# Patient Record
Sex: Male | Born: 1995 | Hispanic: No | Marital: Single | Smoking: Never smoker
Health system: Southern US, Community
[De-identification: ages and names within clinical notes are randomized; demographics above are authoritative.]

## PROBLEM LIST (undated history)

## (undated) DIAGNOSIS — M549 Dorsalgia, unspecified: Secondary | ICD-10-CM

## (undated) DIAGNOSIS — K589 Irritable bowel syndrome without diarrhea: Secondary | ICD-10-CM

## (undated) DIAGNOSIS — H5319 Other subjective visual disturbances: Secondary | ICD-10-CM

## (undated) DIAGNOSIS — M25552 Pain in left hip: Secondary | ICD-10-CM

## (undated) DIAGNOSIS — M4120 Other idiopathic scoliosis, site unspecified: Secondary | ICD-10-CM

## (undated) DIAGNOSIS — R7989 Other specified abnormal findings of blood chemistry: Secondary | ICD-10-CM

## (undated) DIAGNOSIS — G8929 Other chronic pain: Secondary | ICD-10-CM

## (undated) DIAGNOSIS — T7840XA Allergy, unspecified, initial encounter: Secondary | ICD-10-CM

## (undated) DIAGNOSIS — H53129 Transient visual loss, unspecified eye: Secondary | ICD-10-CM

## (undated) DIAGNOSIS — R519 Headache, unspecified: Secondary | ICD-10-CM

## (undated) DIAGNOSIS — Q760 Spina bifida occulta: Secondary | ICD-10-CM

## (undated) DIAGNOSIS — R51 Headache: Secondary | ICD-10-CM

## (undated) DIAGNOSIS — M5412 Radiculopathy, cervical region: Secondary | ICD-10-CM

## (undated) DIAGNOSIS — R0683 Snoring: Secondary | ICD-10-CM

## (undated) HISTORY — DX: Other chronic pain: G89.29

## (undated) HISTORY — DX: Allergy, unspecified, initial encounter: T78.40XA

## (undated) HISTORY — DX: Snoring: R06.83

## (undated) HISTORY — PX: TONSILLECTOMY AND ADENOIDECTOMY: SUR1326

## (undated) HISTORY — DX: Headache: R51

## (undated) HISTORY — DX: Transient visual loss, unspecified eye: H53.129

## (undated) HISTORY — DX: Headache, unspecified: R51.9

## (undated) HISTORY — DX: Other specified abnormal findings of blood chemistry: R79.89

## (undated) HISTORY — DX: Radiculopathy, cervical region: M54.12

## (undated) HISTORY — PX: TYMPANOSTOMY TUBE PLACEMENT: SHX32

## (undated) HISTORY — DX: Dorsalgia, unspecified: M54.9

## (undated) HISTORY — DX: Other idiopathic scoliosis, site unspecified: M41.20

## (undated) HISTORY — DX: Irritable bowel syndrome, unspecified: K58.9

## (undated) HISTORY — DX: Spina bifida occulta: Q76.0

## (undated) HISTORY — DX: Other subjective visual disturbances: H53.19

## (undated) HISTORY — DX: Pain in left hip: M25.552

---
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
## 2015-06-06 ENCOUNTER — Encounter: Payer: Self-pay | Admitting: Family Medicine

## 2015-06-06 ENCOUNTER — Ambulatory Visit (INDEPENDENT_AMBULATORY_CARE_PROVIDER_SITE_OTHER): Payer: 59 | Admitting: Family Medicine

## 2015-06-06 VITALS — BP 126/84 | HR 105 | Temp 102.0°F | Resp 18 | Ht 65.0 in | Wt 284.3 lb

## 2015-06-06 DIAGNOSIS — G8929 Other chronic pain: Secondary | ICD-10-CM | POA: Insufficient documentation

## 2015-06-06 DIAGNOSIS — J069 Acute upper respiratory infection, unspecified: Secondary | ICD-10-CM | POA: Diagnosis not present

## 2015-06-06 DIAGNOSIS — M7062 Trochanteric bursitis, left hip: Secondary | ICD-10-CM | POA: Insufficient documentation

## 2015-06-06 DIAGNOSIS — M549 Dorsalgia, unspecified: Secondary | ICD-10-CM

## 2015-06-06 DIAGNOSIS — E669 Obesity, unspecified: Secondary | ICD-10-CM | POA: Insufficient documentation

## 2015-06-06 DIAGNOSIS — G43109 Migraine with aura, not intractable, without status migrainosus: Secondary | ICD-10-CM | POA: Insufficient documentation

## 2015-06-06 DIAGNOSIS — J302 Other seasonal allergic rhinitis: Secondary | ICD-10-CM | POA: Insufficient documentation

## 2015-06-06 DIAGNOSIS — M4100 Infantile idiopathic scoliosis, site unspecified: Secondary | ICD-10-CM | POA: Insufficient documentation

## 2015-06-06 DIAGNOSIS — K589 Irritable bowel syndrome without diarrhea: Secondary | ICD-10-CM | POA: Insufficient documentation

## 2015-06-06 DIAGNOSIS — Q760 Spina bifida occulta: Secondary | ICD-10-CM | POA: Insufficient documentation

## 2015-06-06 MED ORDER — IBUPROFEN 800 MG PO TABS: 800.0000 mg | ORAL_TABLET | Freq: Three times a day (TID) | ORAL | Status: DC | PRN

## 2015-06-06 MED ORDER — BENZONATATE 100 MG PO CAPS: 100.0000 mg | ORAL_CAPSULE | Freq: Three times a day (TID) | ORAL | Status: DC | PRN

## 2015-06-06 NOTE — Patient Instructions (Signed)
Trochanteric Bursitis You have hip pain due to trochanteric bursitis. Bursitis means that the sack near the outside of the hip is filled with fluid and inflamed. This sack is made up of protective soft tissue. The pain from trochanteric bursitis can be severe and keep you from sleep. It can radiate to the buttocks or down the outside of the thigh to the knee. The pain is almost always worse when rising from the seated or lying position and with walking. Pain can improve after you take a few steps. It happens more often in people with hip joint and lumbar spine problems, such as arthritis or previous surgery. Very rarely the trochanteric bursa can become infected, and antibiotics and/or surgery may be needed. Treatment often includes an injection of local anesthetic mixed with cortisone medicine. This medicine is injected into the area where it is most tender over the hip. Repeat injections may be necessary if the response to treatment is slow. You can apply ice packs over the tender area for 30 minutes every 2 hours for the next few days. Anti-inflammatory and/or narcotic pain medicine may also be helpful. Limit your activity for the next few days if the pain continues. See your caregiver in 5-10 days if you are not greatly improved.  SEEK IMMEDIATE MEDICAL CARE IF:  You develop severe pain, fever, or increased redness.  You have pain that radiates below the knee. EXERCISES STRETCHING EXERCISES - Trochanteric Bursitis  These exercises may help you when beginning to rehabilitate your injury. Your symptoms may resolve with or without further involvement from your physician, physical therapist, or athletic trainer. While completing these exercises, remember:   Restoring tissue flexibility helps normal motion to return to the joints. This allows healthier, less painful movement and activity.  An effective stretch should be held for at least 30 seconds.  A stretch should never be painful. You should only  feel a gentle lengthening or release in the stretched tissue. STRETCH - Iliotibial Band  On the floor or bed, lie on your side so your injured leg is on top. Bend your knee and grab your ankle.  Slowly bring your knee back so that your thigh is in line with your trunk. Keep your heel at your buttocks and gently arch your back so your head, shoulders and hips line up.  Slowly lower your leg so that your knee approaches the floor/bed until you feel a gentle stretch on the outside of your thigh. If you do not feel a stretch and your knee will not fall farther, place the heel of your opposite foot on top of your knee and pull your thigh down farther.  Hold this stretch for __________ seconds.  Repeat __________ times. Complete this exercise __________ times per day. STRETCH - Hamstrings, Supine   Lie on your back. Loop a belt or towel over the ball of your foot as shown.  Straighten your knee and slowly pull on the belt to raise your injured leg. Do not allow the knee to bend. Keep your opposite leg flat on the floor.  Raise the leg until you feel a gentle stretch behind your knee or thigh. Hold this position for __________ seconds.  Repeat __________ times. Complete this stretch __________ times per day. STRETCH - Quadriceps, Prone   Lie on your stomach on a firm surface, such as a bed or padded floor.  Bend your knee and grasp your ankle. If you are unable to reach your ankle or pant leg, use a belt   around your foot to lengthen your reach.  Gently pull your heel toward your buttocks. Your knee should not slide out to the side. You should feel a stretch in the front of your thigh and/or knee.  Hold this position for __________ seconds.  Repeat __________ times. Complete this stretch __________ times per day. STRETCHING - Hip Flexors, Lunge Half kneel with your knee on the floor and your opposite knee bent and directly over your ankle.  Keep good posture with your head over your  shoulders. Tighten your buttocks to point your tailbone downward; this will prevent your back from arching too much.  You should feel a gentle stretch in the front of your thigh and/or hip. If you do not feel any resistance, slightly slide your opposite foot forward and then slowly lunge forward so your knee once again lines up over your ankle. Be sure your tailbone remains pointed downward.  Hold this stretch for __________ seconds.  Repeat __________ times. Complete this stretch __________ times per day. STRETCH - Adductors, Lunge  While standing, spread your legs.  Lean away from your injured leg by bending your opposite knee. You may rest your hands on your thigh for balance.  You should feel a stretch in your inner thigh. Hold for __________ seconds.  Repeat __________ times. Complete this exercise __________ times per day. Document Released: 10/21/2004 Document Revised: 01/28/2014 Document Reviewed: 12/26/2008 Exit[employer] Patient Information 2015 [employer]. This information is not intended to replace advice given to you by your health care provider. Make sure you discuss any questions you have with your health care provider.  

## 2015-06-06 NOTE — Progress Notes (Signed)
Name: Kerry Ford   MRN: [id number]    DOB: [birth year]   Date:06/06/2015       Progress Note  Subjective  Chief Complaint  Chief Complaint  Patient presents with  . URI    Onset-Tuesday morning with a sore throat, fever, chills, stuffy nose, drainage, cough-green phlegm, and chest discomfort from coughing. Taking DayQuil and NyQuil with some relief.  . Hip Pain    Left Hip-Onset-1 month, Intermittent pain, on feet all day and had cortisone shot in the past and it helped.     HPI  URI: he has been sick for the past four days, it started with sore throat, nasal congestion, rhinorrhea, post-nasal drainage, facial pressure, followed by a wet cough. He is taking otc medication and symptoms are better, except that he had a fever this am ( checked at work ) did not take any antipyretics since this am, and is afebrile and feeling better now. He came in to make sure he is okay. Worse symptoms now is the cough  Left hip pain: previous history of trochanteric bursitis, and improved with steroid injections years ago. Pain is described as aching / throbbing , intermittent, worse when he gets home from work and goes to bed. Pressure and sleeping on left lateral decubitus aggravates it. He has been taking Motrin prn.    Patient Active Problem List   Diagnosis Date Noted  . IBS (irritable bowel syndrome) 06/06/2015  . Childhood obesity 06/06/2015  . Seasonal allergic rhinitis 06/06/2015  . Spina bifida occulta 06/06/2015  . Chronic back pain 06/06/2015  . Scoliosis, idiopathic, infantile 06/06/2015  . 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 Trochanteric bursitis of left hip 06/06/2015    Past Surgical History  Procedure Laterality Date  . Tympanostomy tube placement    . Tonsillectomy and adenoidectomy      Family History  Problem Relation Age of Onset  . Hypothyroidism Mother     Social History   Social History  . Marital Status: Single    Spouse Name: N/A  . Number of Children:  N/A  . Years of Education: N/A   Occupational History  . Not on file.   Social History Main Topics  . Smoking status: Never Smoker   . Smokeless tobacco: Never Used  . Alcohol Use: No  . Drug Use: No  . Sexual Activity: Not Currently   Other Topics Concern  . Not on file   Social History Narrative     Current outpatient prescriptions:  .  benzonatate (TESSALON) 100 MG capsule, Take 1 capsule (100 mg total) by mouth 3 (three) times daily as needed for cough., Disp: 40 capsule, Rfl: 0  Allergies  Allergen Reactions  . Wasp Venom Swelling     ROS  Constitutional: Negative for fever or weight change.  Respiratory: positive  for cough no shortness of breath.   Cardiovascular: Negative for chest pain or palpitations.  Gastrointestinal: Negative for abdominal pain, no bowel changes.  Musculoskeletal: Negative for gait problem or joint swelling.  Skin: Negative for rash.  Neurological: Negative for dizziness or  headache.  No other specific complaints in a complete review of systems (except as listed in HPI above).  Objective  Filed Vitals:   06/06/15 1629  BP: 126/84  Pulse: 105  Temp: 102 F (38.9 C)  TempSrc: Oral  Resp: 18  Height:  (1.651 m)  Weight: 284 lb 4.8 oz (128.958 kg)  SpO2: 97%    Body mass index is  47.31 kg/(m^2).  Physical Exam Constitutional: Patient appears well-developed and well-nourished. Obese No distress.  HEENT: head atraumatic, normocephalic, pupils equal and reactive to light, ears normal TM, neck supple, throat within normal limits, mild erythema of soft palate , tender during percussion of maxillary sinus bilaterally  Cardiovascular: Normal rate, regular rhythm and normal heart sounds.  No murmur heard. No BLE edema. Pulmonary/Chest: Effort normal and breath sounds normal. No respiratory distress. Abdominal: Soft.  There is no tenderness. Psychiatric: Patient has a normal mood and affect. behavior is normal. Judgment and thought  content normal. Muscular Skeletal: pain during palpation of left lateral trochanteric bursa, and with external rotation of left hip  PHQ2/9: Depression screen PHQ 2/9 06/06/2015  Decreased Interest 0  Down, Depressed, Hopeless 0  PHQ - 2 Score 0     Fall Risk: Fall Risk  06/06/2015  Falls in the past year? No    Functional Status Survey: Is the patient deaf or have difficulty hearing?: No Does the patient have difficulty seeing, even when wearing glasses/contacts?: Yes (glasses) Does the patient have difficulty concentrating, remembering, or making decisions?: No Does the patient have difficulty walking or climbing stairs?: No Does the patient have difficulty dressing or bathing?: No Does the patient have difficulty doing errands alone such as visiting a doctor's office or shopping?: No    Assessment & Plan  1. Upper respiratory infection Advised to continue otc medication, rest , fluids and start cough suppressant, may also take mucinex otc, return if worsening of symptoms - benzonatate (TESSALON) 100 MG capsule; Take 1 capsule (100 mg total) by mouth 3 (three) times daily as needed for cough.  Dispense: 40 capsule; Refill: 0   2. Trochanteric bursitis of left hip He will resume Motrin and return for a bursa injection at his earliest convenience  - ibuprofen (ADVIL,MOTRIN) 800 MG tablet; Take 1 tablet (800 mg total) by mouth every 8 (eight) hours as needed for moderate pain.  Dispense: 30 tablet; Refill: 0

## 2015-06-07 ENCOUNTER — Encounter: Payer: Self-pay | Admitting: Family Medicine

## 2015-06-07 DIAGNOSIS — R0683 Snoring: Secondary | ICD-10-CM | POA: Insufficient documentation

## 2015-06-07 DIAGNOSIS — R519 Headache, unspecified: Secondary | ICD-10-CM | POA: Insufficient documentation

## 2015-06-07 DIAGNOSIS — R51 Headache: Secondary | ICD-10-CM

## 2015-06-07 DIAGNOSIS — E349 Endocrine disorder, unspecified: Secondary | ICD-10-CM | POA: Insufficient documentation

## 2015-06-09 ENCOUNTER — Ambulatory Visit: Payer: 59 | Admitting: Family Medicine

## 2015-06-10 ENCOUNTER — Encounter: Payer: Self-pay | Admitting: Family Medicine

## 2015-06-10 ENCOUNTER — Ambulatory Visit (INDEPENDENT_AMBULATORY_CARE_PROVIDER_SITE_OTHER): Payer: 59 | Admitting: Family Medicine

## 2015-06-10 VITALS — BP 116/82 | HR 93 | Temp 98.2°F | Resp 16 | Ht 65.0 in | Wt 280.0 lb

## 2015-06-10 DIAGNOSIS — M7062 Trochanteric bursitis, left hip: Secondary | ICD-10-CM

## 2015-06-10 DIAGNOSIS — M4100 Infantile idiopathic scoliosis, site unspecified: Secondary | ICD-10-CM

## 2015-06-10 DIAGNOSIS — Z23 Encounter for immunization: Secondary | ICD-10-CM | POA: Diagnosis not present

## 2015-06-10 MED ORDER — LIDOCAINE HCL (PF) 1 % IJ SOLN
2.0000 mL | Freq: Once | INTRAMUSCULAR | Status: AC
  Administered 2015-06-10: 2 mL via INTRADERMAL

## 2015-06-10 MED ORDER — TRIAMCINOLONE ACETONIDE 40 MG/ML IJ SUSP
40.0000 mg | Freq: Once | INTRAMUSCULAR | Status: AC
  Administered 2015-06-10: 40 mg via INTRAMUSCULAR

## 2015-06-10 NOTE — Progress Notes (Signed)
Name: Kerry Ford   MRN: [id number]    DOB: 06-25-1996   Date:06/10/2015       Progress Note  Subjective  Chief Complaint  No chief complaint on file.   HPI  Trochanteric bursa left: : previous history of trochanteric bursitis, and improved with steroid injections years ago. Pain is described as aching / throbbing , intermittent, worse when he gets home from work and goes to bed, also when standing or exercising. Pressure and sleeping on left lateral decubitus aggravates it. He has been taking Motrin prn. He cam in today for an bursa injections  Scoliosis: seen years ago at [facility], he continues to have back pain, left shoulder lower than right side, pain is aching like, daily and constant located on lumbar back. At times back feels very stiff.   Patient Active Problem List   Diagnosis Date Noted  . Snores 06/07/2015  . Hypotestosteronism 06/07/2015  . Bilateral headache 06/07/2015  . IBS (irritable bowel syndrome) 06/06/2015  . Childhood obesity 06/06/2015  . Seasonal allergic rhinitis 06/06/2015  . Spina bifida occulta 06/06/2015  . Chronic back pain 06/06/2015  . Scoliosis, idiopathic, infantile 06/06/2015  . 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 Trochanteric bursitis of left hip 06/06/2015  . Breast development in males 08/31/2013  . Iliotibial band syndrome 12/22/2011  . Back pain, chronic 05/23/2009    Past Surgical History  Procedure Laterality Date  . Tympanostomy tube placement    . Tonsillectomy and adenoidectomy      Family History  Problem Relation Age of Onset  . Hypothyroidism Mother     Social History   Social History  . Marital Status: Single    Spouse Name: N/A  . Number of Children: N/A  . Years of Education: N/A   Occupational History  . Not on file.   Social History Main Topics  . Smoking status: Never Smoker   . Smokeless tobacco: Never Used  . Alcohol Use: No  . Drug Use: No  . Sexual Activity: Not Currently   Other Topics  Concern  . Not on file   Social History Narrative     Current outpatient prescriptions:  .  benzonatate (TESSALON) 100 MG capsule, Take 1 capsule (100 mg total) by mouth 3 (three) times daily as needed for cough., Disp: 40 capsule, Rfl: 0 .  ibuprofen (ADVIL,MOTRIN) 800 MG tablet, Take 1 tablet (800 mg total) by mouth every 8 (eight) hours as needed for moderate pain., Disp: 30 tablet, Rfl: 0  Allergies  Allergen Reactions  . Wasp Venom Swelling     ROS  Constitutional: Negative for fever or weight change.  Respiratory: positive for cough ( but improving )no shortness of breath.  Cardiovascular: Negative for chest pain or palpitations.  Gastrointestinal: Negative for abdominal pain, no bowel changes.  Musculoskeletal: Negative for gait problem or joint swelling. Back pain Skin: Negative for rash.  Neurological: Negative for dizziness or headache.  No other specific complaints in a complete review of systems (except as listed in HPI above  Objective  BP 116/82 mmHg  Pulse 93  Temp(Src) 98.2 F (36.8 C) (Oral)  Resp 16  Ht 5\' 5"  (1.651 m)  Wt 280 lb (127.007 kg)  BMI 46.59 kg/m2  SpO2 98%   Physical Exam   Constitutional: Patient appears well-developed and well-nourished. Obese No distress.  HEENT: head atraumatic, normocephalic, pupils equal and reactive to light,neck supple, throat within normal limits Cardiovascular: Normal rate, regular rhythm and normal heart sounds.  No  murmur heard. No BLE edema. Pulmonary/Chest: Effort normal and breath sounds normal. No respiratory distress. Abdominal: Soft.  There is no tenderness. Psychiatric: Patient has a normal mood and affect. behavior is normal. Judgment and thought content normal. Muscular Skeletal: left shoulder is much lower than right side, mild pain during palpation of lumbar spine, normal rom, but pain with extension of lumbar spine and lateral bending. Pain during palpation of left trochanteric  bursa  PHQ2/9: Depression screen [facility] 2/9 06/06/2015  Decreased Interest 0  Down, Depressed, Hopeless 0  PHQ - 2 Score 0     Fall Risk: Fall Risk  06/06/2015  Falls in the past year? No      Assessment & Plan  1. Trochanteric bursitis of left hip  - triamcinolone acetonide (KENALOG-40) injection 40 mg; Inject 1 mL (40 mg total) into the muscle once. - lidocaine (PF) (XYLOCAINE) 1 % injection 2 mL; Inject 2 mLs into the skin once. Consent form signed Place of maximum pain localized over left trochanteric bursa Injection with lidocaine 1% and Kenalog /1 ml on muscle  Patient tolerated procedure well No side effects Pain improved after injection    2. Scoliosis, idiopathic, infantile  - Ambulatory referral to Orthopedic Surgery  3. Needs flu shot  - Flu Vaccine QUAD 36+ mos IM

## 2015-07-08 ENCOUNTER — Emergency Department: Payer: No Typology Code available for payment source

## 2015-07-08 ENCOUNTER — Encounter: Payer: Self-pay | Admitting: Emergency Medicine

## 2015-07-08 ENCOUNTER — Emergency Department
Admission: EM | Admit: 2015-07-08 | Discharge: 2015-07-08 | Disposition: A | Discharge status: Home / Self Care | Payer: No Typology Code available for payment source | Attending: Emergency Medicine | Admitting: Emergency Medicine

## 2015-07-08 DIAGNOSIS — S199XXA Unspecified injury of neck, initial encounter: Secondary | ICD-10-CM | POA: Insufficient documentation

## 2015-07-08 DIAGNOSIS — S20219A Contusion of unspecified front wall of thorax, initial encounter: Secondary | ICD-10-CM | POA: Diagnosis not present

## 2015-07-08 DIAGNOSIS — Y9389 Activity, other specified: Secondary | ICD-10-CM | POA: Diagnosis not present

## 2015-07-08 DIAGNOSIS — Y9269 Other specified industrial and construction area as the place of occurrence of the external cause: Secondary | ICD-10-CM | POA: Insufficient documentation

## 2015-07-08 DIAGNOSIS — S46811A Strain of other muscles, fascia and tendons at shoulder and upper arm level, right arm, initial encounter: Secondary | ICD-10-CM | POA: Diagnosis not present

## 2015-07-08 DIAGNOSIS — S4991XA Unspecified injury of right shoulder and upper arm, initial encounter: Secondary | ICD-10-CM | POA: Diagnosis present

## 2015-07-08 DIAGNOSIS — S299XXA Unspecified injury of thorax, initial encounter: Secondary | ICD-10-CM | POA: Diagnosis not present

## 2015-07-08 DIAGNOSIS — R0781 Pleurodynia: Secondary | ICD-10-CM

## 2015-07-08 DIAGNOSIS — Y998 Other external cause status: Secondary | ICD-10-CM | POA: Diagnosis not present

## 2015-07-08 MED ORDER — IBUPROFEN 800 MG PO TABS: 800.0000 mg | ORAL_TABLET | Freq: Three times a day (TID) | ORAL | Status: DC | PRN
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
## 2015-07-08 NOTE — ED Notes (Signed)
Cervical collar placed in triage.
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
## 2015-07-08 NOTE — ED Notes (Signed)
Patient transported to X-ray 

## 2015-07-17 ENCOUNTER — Encounter: Payer: Self-pay | Admitting: Family Medicine

## 2015-07-18 ENCOUNTER — Ambulatory Visit: Payer: 59 | Admitting: Family Medicine

## 2015-08-07 ENCOUNTER — Encounter: Payer: 59 | Admitting: Family Medicine

## 2016-04-12 ENCOUNTER — Emergency Department: Payer: 59

## 2016-04-12 ENCOUNTER — Emergency Department
Admission: EM | Admit: 2016-04-12 | Discharge: 2016-04-12 | Disposition: A | Discharge status: Home / Self Care | Payer: 59 | Attending: Emergency Medicine | Admitting: Emergency Medicine

## 2016-04-12 DIAGNOSIS — R1010 Upper abdominal pain, unspecified: Secondary | ICD-10-CM

## 2016-04-12 DIAGNOSIS — Z791 Long term (current) use of non-steroidal anti-inflammatories (NSAID): Secondary | ICD-10-CM | POA: Diagnosis not present

## 2016-04-12 DIAGNOSIS — Z9622 Myringotomy tube(s) status: Secondary | ICD-10-CM | POA: Diagnosis not present

## 2016-04-12 DIAGNOSIS — R1011 Right upper quadrant pain: Secondary | ICD-10-CM | POA: Diagnosis present

## 2016-04-12 LAB — COMPREHENSIVE METABOLIC PANEL
ALT: 15 U/L — ABNORMAL LOW (ref 17–63)
AST: 19 U/L (ref 15–41)
Albumin: 3.9 g/dL (ref 3.5–5.0)
Alkaline Phosphatase: 71 U/L (ref 38–126)
Anion gap: 8 (ref 5–15)
BUN: 8 mg/dL (ref 6–20)
CO2: 24 mmol/L (ref 22–32)
Calcium: 8.5 mg/dL — ABNORMAL LOW (ref 8.9–10.3)
Chloride: 106 mmol/L (ref 101–111)
Creatinine, Ser: 0.58 mg/dL — ABNORMAL LOW (ref 0.61–1.24)
GFR calc Af Amer: >60 mL/min (ref >60)
GFR calc non Af Amer: >60 mL/min (ref >60)
Glucose, Bld: 103 mg/dL — ABNORMAL HIGH (ref 65–99)
Potassium: 3.8 mmol/L (ref 3.5–5.1)
Sodium: 138 mmol/L (ref 135–145)
Total Bilirubin: 0.4 mg/dL (ref 0.3–1.2)
Total Protein: 6.7 g/dL (ref 6.5–8.1)

## 2016-04-12 LAB — CBC
HCT: 44.6 % (ref 40.0–52.0)
Hemoglobin: 16 g/dL (ref 13.0–18.0)
MCH: 30.8 pg (ref 26.0–34.0)
MCHC: 36 g/dL (ref 32.0–36.0)
MCV: 85.6 fL (ref 80.0–100.0)
Platelets: 290 10*3/uL (ref 150–440)
RBC: 5.21 MIL/uL (ref 4.40–5.90)
RDW: 13.2 % (ref 11.5–14.5)
WBC: 10.1 10*3/uL (ref 3.8–10.6)

## 2016-04-12 LAB — LIPASE, BLOOD: Lipase: 18 U/L (ref 11–51)

## 2016-04-12 MED ORDER — ONDANSETRON HCL 4 MG/2ML IJ SOLN
INTRAMUSCULAR | Status: AC
  Administered 2016-04-12: 4 mg via INTRAVENOUS
  Filled 2016-04-12: qty 2

## 2016-04-12 MED ORDER — MORPHINE SULFATE (PF) 2 MG/ML IV SOLN
2.0000 mg | Freq: Once | INTRAVENOUS | Status: AC
Start: 2016-04-12 — End: 2016-04-12
  Administered 2016-04-12: 2 mg via INTRAVENOUS

## 2016-04-12 MED ORDER — GI COCKTAIL ~~LOC~~
30.0000 mL | Freq: Once | ORAL | Status: AC
  Administered 2016-04-12: 30 mL via ORAL
  Filled 2016-04-12: qty 30

## 2016-04-12 MED ORDER — SUCRALFATE 1 G PO TABS: 1.0000 g | ORAL_TABLET | Freq: Four times a day (QID) | ORAL | Status: DC

## 2016-04-12 MED ORDER — ONDANSETRON HCL 4 MG/2ML IJ SOLN
4.0000 mg | Freq: Once | INTRAMUSCULAR | Status: AC
  Administered 2016-04-12: 4 mg via INTRAVENOUS

## 2016-04-12 MED ORDER — MORPHINE SULFATE (PF) 2 MG/ML IV SOLN
INTRAVENOUS | Status: AC
  Administered 2016-04-12: 2 mg via INTRAVENOUS
  Filled 2016-04-12: qty 1

## 2016-04-12 MED ORDER — OMEPRAZOLE 20 MG PO CPDR: 20.0000 mg | DELAYED_RELEASE_CAPSULE | Freq: Every day | ORAL | Status: DC

## 2016-04-12 NOTE — ED Provider Notes (Signed)
[facility] Emergency Department Provider Note   ____________________________________________    I have reviewed the triage vital signs and the nursing notes.   HISTORY  Chief Complaint Abdominal Pain     HPI Kerry Ford is a 20 y.o. male who presents with complaints of upper abdominal pain. He reports this is been occurring for approximately 2 weeks. Pain is worse after eating and he frequently has nausea and vomiting after eating. He reports the pain is central and sometimes in the right upper quadrant. No history of abdominal surgery. He has not seen his physician about this.   Past Medical History  Diagnosis Date  . Bilateral headaches   . Low testosterone   . Snoring   . Scintillating scotoma     when he is stressed  . Left hip pain   . Radiculitis, cervical   . Irritable bowel   . Spina bifida occulta   . Allergy   . Chronic back pain   . Idiopathic scoliosis     Patient Active Problem List   Diagnosis Date Noted  . Snores 06/07/2015  . Hypotestosteronism 06/07/2015  . Bilateral headache 06/07/2015  . IBS (irritable bowel syndrome) 06/06/2015  . Childhood obesity 06/06/2015  . Seasonal allergic rhinitis 06/06/2015  . Spina bifida occulta 06/06/2015  . Chronic back pain 06/06/2015  . Scoliosis, idiopathic, infantile 06/06/2015  . 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 Trochanteric bursitis of left hip 06/06/2015  . Breast development in males 08/31/2013  . Iliotibial band syndrome 12/22/2011  . Back pain, chronic 05/23/2009    Past Surgical History  Procedure Laterality Date  . Tympanostomy tube placement    . Tonsillectomy and adenoidectomy      Current Outpatient Rx  Name  Route  Sig  Dispense  Refill  . ibuprofen (ADVIL,MOTRIN) 800 MG tablet   Oral   Take 1 tablet (800 mg total) by mouth every 8 (eight) hours as needed.   20 tablet   0   . omeprazole (PRILOSEC) 20 MG capsule   Oral   Take 1 capsule (20  mg total) by mouth daily.   30 capsule   1   . sucralfate (CARAFATE) 1 g tablet   Oral   Take 1 tablet (1 g total) by mouth 4 (four) times daily.   60 tablet   1     Allergies Wasp venom  Family History  Problem Relation Age of Onset  . Hypothyroidism Mother     Social History Social History  Substance Use Topics  . Smoking status: Never Smoker   . Smokeless tobacco: Never Used  . Alcohol Use: No    Review of Systems  Constitutional: No fever/chills Eyes: No visual changes. No discharge ENT: No sore throat. Cardiovascular: Denies chest pain. Respiratory: Denies shortness of breath. Gastrointestinal: As above.   Genitourinary: Negative for dysuria. Musculoskeletal: Negative for back pain. Skin: Negative for rash. Neurological: Negative for headaches   10-point ROS otherwise negative.  ____________________________________________   PHYSICAL EXAM:  VITAL SIGNS: ED Triage Vitals  Enc Vitals Group     BP 04/12/16 1028 124/72 mmHg     Pulse Rate 04/12/16 1028 67     Resp 04/12/16 1028 16     Temp 04/12/16 1028 98.3 F (36.8 C)     Temp Source 04/12/16 1028 Oral     SpO2 04/12/16 1028 97 %     Weight 04/12/16 1028 273 lb (123.832 kg)     Height  04/12/16 1028 5' 6.5" (1.689 m)     Head Cir --      Peak Flow --      Pain Score 04/12/16 1014 7     Pain Loc --      Pain Edu? --      Excl. in GC? --     Constitutional: Alert and oriented. No acute distress. Pleasant and interactive Eyes: Conjunctivae are normal.  Head: Atraumatic.Normocephalic  Mouth/Throat: Mucous membranes are moist.  Oropharynx non-erythematous. Neck:  Painless ROM Cardiovascular: Normal rate, regular rhythm. Grossly normal heart sounds.  Good peripheral circulation. Respiratory: Normal respiratory effort.  No retractions. Lungs CTAB. Gastrointestinal: Mild tenderness in the right upper quadrant and epigastric areaNone No distention.  No CVA tenderness. Genitourinary:  deferred Musculoskeletal: No lower extremity tenderness nor edema.  Warm and well perfused Neurologic:  Normal speech and language. No gross focal neurologic deficits are appreciated.  Skin:  Skin is warm, dry and intact. No rash noted. Psychiatric: Mood and affect are normal. Speech and behavior are normal.  ____________________________________________   LABS (all labs ordered are listed, but only abnormal results are displayed)  Labs Reviewed  COMPREHENSIVE METABOLIC PANEL - Abnormal; Notable for the following:    Glucose, Bld 103 (*)    Creatinine, Ser 0.58 (*)    Calcium 8.5 (*)    ALT 15 (*)    All other components within normal limits  LIPASE, BLOOD  CBC  URINALYSIS COMPLETEWITH MICROSCOPIC ([facility] ONLY)   ____________________________________________  EKG  None ____________________________________________  RADIOLOGY  Ultrasound unremarkable ____________________________________________   PROCEDURES  Procedure(s) performed: No    Critical Care performed: No ____________________________________________   INITIAL IMPRESSION / ASSESSMENT AND PLAN / ED COURSE  Pertinent labs & imaging results that were available during my care of the patient were reviewed by me and considered in my medical decision making (see chart for details).  Patient presents with upper abdominal pain. Cholecystitis, gastritis, PUD, pancreatitis or on the differential. We'll obtain ultrasound, labs, give pain medication and GI cocktail and reevaluate.  Ultrasound is reassuring. Labs are benign. We will treat with Prilosec and Carafate and have the patient follow up with GI for further workup. Return precautions discussed. ____________________________________________   FINAL CLINICAL IMPRESSION(S) / ED DIAGNOSES  Final diagnoses:  Upper abdominal pain      NEW MEDICATIONS STARTED DURING THIS VISIT:  Discharge Medication List as of 04/12/2016 12:24 PM    START taking these  medications   Details  omeprazole (PRILOSEC) 20 MG capsule Take 1 capsule (20 mg total) by mouth daily., Starting 04/12/2016, Until Tue 04/12/17, Print    sucralfate (CARAFATE) 1 g tablet Take 1 tablet (1 g total) by mouth 4 (four) times daily., Starting 04/12/2016, Until Tue 04/12/17, Print         Note:  This document was prepared using Dragon voice recognition software and may include unintentional dictation errors.    Jene Everyobert Glenden Rossell, MD 04/12/16 [phone]

## 2016-04-12 NOTE — ED Notes (Signed)
Pt aware of need for urine  

## 2016-04-12 NOTE — ED Notes (Signed)
Pt c/o upper abd pain with intermittent N/V for the past 3 weeks, states pain is worse after eating, vomiting 2-3x/day..Marland Kitchen

## 2016-04-12 NOTE — ED Notes (Signed)
Patient transported to Ultrasound 

## 2016-04-12 NOTE — Discharge Instructions (Signed)

## 2016-04-13 ENCOUNTER — Telehealth: Payer: Self-pay

## 2016-04-13 DIAGNOSIS — R1011 Right upper quadrant pain: Secondary | ICD-10-CM

## 2016-04-13 NOTE — Telephone Encounter (Signed)
Call made to Central Scheduling at this time. [facility] is [facility] at 0715 on Thursday morning, 04/15/16. No narcotics 24 hours prior and NPO after Midnight.  Return phone call made to patient. Explained all above information and what testing involves. He verbalizes understanding of appointments, instructions, and directions.

## 2016-04-13 NOTE — Telephone Encounter (Signed)
Patient called in to schedule his follow-up visit from the Emergency Visit on 04/12/16.  Reviewed patient's ER notes. Spoke with Dr. Tonita CongWoodham regarding this patient. He will need a HIDA scan prior to setting up a visit with Dr. Tonita CongWoodham.   Returned phone call to patient. Explained what needed to be done and patient is in agreement.

## 2016-04-13 NOTE — Telephone Encounter (Signed)
Kerry Ford (patient)  was in the emergency department 04/12/16 for abdominal pain. Harriett Sineancy (mother) claims that he has been throwing up this month and the third time her threw up blood. Kerry Ford was in a lot of pain yesterday. They drew blood and preformed an ultrasound. They referred him to see doctor Servando SnareWohl today, I spoke with Amber about this. She will [facility] and see if we can see him today. I will be calling her back to schedule an appointment for her son.

## 2016-04-14 ENCOUNTER — Other Ambulatory Visit: Payer: Self-pay

## 2016-04-15 ENCOUNTER — Telehealth: Payer: Self-pay

## 2016-04-15 ENCOUNTER — Ambulatory Visit (INDEPENDENT_AMBULATORY_CARE_PROVIDER_SITE_OTHER): Payer: 59 | Admitting: General Surgery

## 2016-04-15 ENCOUNTER — Encounter: Payer: Self-pay | Admitting: General Surgery

## 2016-04-15 ENCOUNTER — Ambulatory Visit
Admission: RE | Admit: 2016-04-15 | Discharge: 2016-04-15 | Disposition: A | Discharge status: Home / Self Care | Payer: 59 | Source: Ambulatory Visit | Attending: General Surgery | Admitting: General Surgery

## 2016-04-15 VITALS — BP 135/63 | HR 74 | Temp 98.5°F | Ht 65.0 in | Wt 280.0 lb

## 2016-04-15 DIAGNOSIS — R1011 Right upper quadrant pain: Secondary | ICD-10-CM | POA: Diagnosis not present

## 2016-04-15 MED ORDER — TECHNETIUM TC 99M MEBROFENIN IV KIT
5.0000 | PACK | Freq: Once | INTRAVENOUS | Status: AC | PRN
  Administered 2016-04-15: 5.1 via INTRAVENOUS

## 2016-04-15 NOTE — Telephone Encounter (Signed)
Patient needs a referral to Dr.Wohl - IBS and reflux- patient has an appointment on 05/26/16.

## 2016-04-15 NOTE — Patient Instructions (Signed)
Please call our office if you have any questions or concerns.  

## 2016-04-15 NOTE — Progress Notes (Signed)
Patient ID: Kerry Ford, male   DOB: 03-06-1996, 20 y.o.   MRN: [id number]  CC: RUQ PAIN  HPI Kerry Keasngel Marcou is a 20 y.o. male who presents to clinic today from follow-up from the ER earlier this week secondary to right upper quadrant pain. Patient states on Monday he had nausea and vomiting followed by right upper quadrant pain. The symptoms were of such that a prompt him to seek care in the emergency department. He states he was febrile that day to 101.8. He's been afebrile since. Patient states that after eating something at work he developed nausea and vomiting and thought he saw blood in his emesis. Patient's mother reports that he has chronic complaints of postprandial pain. He also has postprandial bowel urgency on a regular basis. He's been told before he had irritable bowel syndrome. Patient continues to have a dull, achy pain to the right upper quadrant but nothing like on Monday. Patient denies any current fevers, chills, chest pain, shortness breath, nausea, vomiting, diarrhea, constipation. He has never been evaluated by a gastroenterologist. Patient's ultrasound and postoperative HIDA scan of both returned normal.  HPI  Past Medical History  Diagnosis Date  . Bilateral headaches   . Low testosterone   . Snoring   . Scintillating scotoma     when he is stressed  . Left hip pain   . Radiculitis, cervical   . Irritable bowel   . Spina bifida occulta   . Allergy   . Chronic back pain   . Idiopathic scoliosis     Past Surgical History  Procedure Laterality Date  . Tympanostomy tube placement    . Tonsillectomy and adenoidectomy      Family History  Problem Relation Age of Onset  . Hypothyroidism Mother   . Diabetes Mother   . Stroke Father   . Cancer Maternal Grandmother   . Diabetes Paternal Grandmother   . Stroke Paternal Grandmother   . Stroke Paternal Grandfather   . Diabetes Paternal Grandfather     Social History Social History  Substance Use Topics  . Smoking  status: Never Smoker   . Smokeless tobacco: Never Used  . Alcohol Use: No    Allergies  Allergen Reactions  . Wasp Venom Swelling    Current Outpatient Prescriptions  Medication Sig Dispense Refill  . ibuprofen (ADVIL,MOTRIN) 800 MG tablet Take 1 tablet (800 mg total) by mouth every 8 (eight) hours as needed. 20 tablet 0  . omeprazole (PRILOSEC) 20 MG capsule Take 1 capsule (20 mg total) by mouth daily. 30 capsule 1  . sucralfate (CARAFATE) 1 g tablet Take 1 tablet (1 g total) by mouth 4 (four) times daily. 60 tablet 1   No current facility-administered medications for this visit.     Review of Systems A Multi-point review of systems was asked and was negative except for the findings documented in the history of present illness  Physical Exam Blood pressure 135/63, pulse 74, temperature 98.5 F (36.9 C), temperature source Oral, height 5\' 5"  (1.651 m), weight 127.007 kg (280 lb). CONSTITUTIONAL: No acute distress. EYES: Pupils are equal, round, and reactive to light, Sclera are non-icteric. EARS, NOSE, MOUTH AND THROAT: The oropharynx is clear. The oral mucosa is pink and moist. Hearing is intact to voice. LYMPH NODES:  Lymph nodes in the neck are normal. RESPIRATORY:  Lungs are clear. There is normal respiratory effort, with equal breath sounds bilaterally, and without pathologic use of accessory muscles. CARDIOVASCULAR: Heart is regular without murmurs,  gallops, or rubs. GI: The abdomen is large, soft, and nondistended. Patient's abdominal exam was inconsistent as he complained of tenderness the right upper quadrant on palpation but with a negative Murphy sign. When palpated with the stethoscope he did not react with any pain. There are no palpable masses. There is no obvious hepatosplenomegaly but difficult to assess due to body habitus. There are normal bowel sounds in all quadrants. GU: Rectal deferred.   MUSCULOSKELETAL: Normal muscle strength and tone. No cyanosis or edema.    SKIN: Turgor is good and there are no pathologic skin lesions or ulcers. NEUROLOGIC: Motor and sensation is grossly normal. Cranial nerves are grossly intact. PSYCH:  Oriented to person, place and time. Affect is normal.  Data Reviewed Images and labs reviewed. The labs from the emergency department are all within normal limits except for a decreased transaminase. There is no leukocytosis and the remainder of his LFTs were totally normal. Ultrasound showed no evidence of cholelithiasis or cholecystitis. HIDA scan performed earlier today showed good uptake with a normal ejection fraction. I have personally reviewed the patient's imaging, laboratory findings and medical records.    Assessment    Right upper quadrant pain    Plan    20 year old obese male with right upper quadrant pain. No evidence of biliary pathology on ultrasound or HIDA scan. Had long conversation with the patient and his family that currently there is no indication for operative intervention for his gallbladder as I cannot prove he has any gallbladder pathology. Patient does have a long-standing history of irritable bowel syndrome and it is possible that he had a viral enteritis or a food intolerance the causes nausea and vomiting and abdominal pain. Patient voiced understanding. As he has never been seen by GI doctor we will refer him to our gastroenterologist Dr. Servando Snare for further evaluation.     Time spent with the patient was 30 minutes, with more than 50% of the time spent in face-to-face education, counseling and care coordination.     Ricarda Frame, MD FACS General Surgeon 04/15/2016, 4:11 PM

## 2016-05-26 ENCOUNTER — Encounter: Payer: Self-pay | Admitting: Gastroenterology

## 2016-05-26 ENCOUNTER — Ambulatory Visit (INDEPENDENT_AMBULATORY_CARE_PROVIDER_SITE_OTHER): Payer: 59 | Admitting: Gastroenterology

## 2016-05-26 VITALS — BP 144/68 | HR 79 | Temp 98.3°F | Ht 65.0 in | Wt 279.0 lb

## 2016-05-26 DIAGNOSIS — R1011 Right upper quadrant pain: Secondary | ICD-10-CM

## 2016-05-26 DIAGNOSIS — R1010 Upper abdominal pain, unspecified: Secondary | ICD-10-CM | POA: Diagnosis not present
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
## 2017-05-13 ENCOUNTER — Emergency Department
Admission: EM | Admit: 2017-05-13 | Discharge: 2017-05-13 | Disposition: A | Discharge status: Home / Self Care | Payer: 59 | Attending: Emergency Medicine | Admitting: Emergency Medicine

## 2017-05-13 ENCOUNTER — Encounter: Payer: Self-pay | Admitting: Emergency Medicine

## 2017-05-13 DIAGNOSIS — H1032 Unspecified acute conjunctivitis, left eye: Secondary | ICD-10-CM | POA: Insufficient documentation

## 2017-05-13 DIAGNOSIS — H5712 Ocular pain, left eye: Secondary | ICD-10-CM | POA: Diagnosis present

## 2017-05-13 MED ORDER — TETRACAINE HCL 0.5 % OP SOLN
2.0000 [drp] | Freq: Once | OPHTHALMIC | Status: AC
  Administered 2017-05-13: 2 [drp] via OPHTHALMIC

## 2017-05-13 MED ORDER — EYE WASH OPHTH SOLN
OPHTHALMIC | Status: AC
  Administered 2017-05-13: 1 [drp] via OPHTHALMIC
  Filled 2017-05-13: qty 118

## 2017-05-13 MED ORDER — FLUORESCEIN SODIUM 0.6 MG OP STRP
1.0000 | ORAL_STRIP | Freq: Once | OPHTHALMIC | Status: AC
  Administered 2017-05-13: 1 via OPHTHALMIC
  Filled 2017-05-13: qty 1

## 2017-05-13 MED ORDER — EYE WASH OPHTH SOLN
1.0000 [drp] | OPHTHALMIC | Status: DC | PRN
  Administered 2017-05-13: 1 [drp] via OPHTHALMIC

## 2017-05-13 MED ORDER — TETRACAINE HCL 0.5 % OP SOLN
OPHTHALMIC | Status: AC
  Administered 2017-05-13: 2 [drp] via OPHTHALMIC
  Filled 2017-05-13: qty 4

## 2017-05-13 MED ORDER — ERYTHROMYCIN 5 MG/GM OP OINT: 1.0000 "application " | TOPICAL_OINTMENT | Freq: Four times a day (QID) | OPHTHALMIC | 0 refills | Status: AC

## 2017-05-13 NOTE — Discharge Instructions (Signed)
Take medication as prescribed. Return to emergency department if symptoms worsen and follow-up with PCP as needed.   °

## 2017-05-13 NOTE — ED Notes (Signed)
Pt reports eye swelling, discharge, blurry vision since Monday. States he has tried eye drops and other home remedies with no relief. Eye is itchy and he has a headache. Pt alert & oriented.

## 2017-05-13 NOTE — ED Notes (Signed)
Pt discharged home after verbalizing understanding of discharge instructions; nad noted. 

## 2017-05-13 NOTE — ED Triage Notes (Signed)
Presents with left eye pain and swelling for about 1 week

## 2017-05-13 NOTE — ED Provider Notes (Signed)
[facility] Emergency Department Provider Note   ____________________________________________   I have reviewed the triage vital signs and the nursing notes.   HISTORY  Chief Complaint Eye Pain    HPI Kerry Ford is a 21 y.o. male presents to the emergency department with left eye pain, swelling, purulent drainage, itchiness and mild blurry vision that has progressively worsened despite conservative treatment since this past Monday. Patient reports initially irritation of the left eye with minimal yellowish drainage that progressed to yellowish green leaving dried crusting along the eyelashes when he would awaken the morning. Patient reports using Visine eyedrops, over-the-counter pinkeye ointment and home remedy of dripping 4 drops of freshly brewed black coffee in his left eye. None of these treatments improved symptoms.Patient denies any change in visual acuity. Patient denies noting any possibility of a foreign body entering his eye.Patient denies fever, chills, headache, vision changes, chest pain, chest tightness, shortness of breath, abdominal pain, nausea and vomiting.  Past Medical History:  Diagnosis Date  . Allergy   . Bilateral headaches   . Chronic back pain   . Idiopathic scoliosis   . Irritable bowel   . Left hip pain   . Low testosterone   . Radiculitis, cervical   . Scintillating scotoma    when he is stressed  . Snoring   . Spina bifida occulta     Patient Active Problem List   Diagnosis Date Noted  . Snores 06/07/2015  . Hypotestosteronism 06/07/2015  . Bilateral headache 06/07/2015  . IBS (irritable bowel syndrome) 06/06/2015  . Childhood obesity 06/06/2015  . Seasonal allergic rhinitis 06/06/2015  . Spina bifida occulta 06/06/2015  . Chronic back pain 06/06/2015  . Scoliosis, idiopathic, infantile 06/06/2015  . 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 Trochanteric bursitis of left hip 06/06/2015  . Breast development  in males 08/31/2013  . Iliotibial band syndrome 12/22/2011  . Back pain, chronic 05/23/2009    Past Surgical History:  Procedure Laterality Date  . TONSILLECTOMY AND ADENOIDECTOMY    . TYMPANOSTOMY TUBE PLACEMENT      Prior to Admission medications   Medication Sig Start Date End Date Taking? Authorizing Provider  erythromycin ophthalmic ointment Place 1 application into the left eye 4 (four) times daily. 05/13/17 05/18/17  Kyi Romanello M, PA-C  omeprazole (PRILOSEC) 20 MG capsule Take 1 capsule (20 mg total) by mouth daily. 04/12/16 04/12/17  Jene Every, MD  sucralfate (CARAFATE) 1 g tablet Take 1 tablet (1 g total) by mouth 4 (four) times daily. 04/12/16 04/12/17  Jene Every, MD    Allergies Wasp venom  Family History  Problem Relation Age of Onset  . Hypothyroidism Mother   . Diabetes Mother   . Stroke Father   . Cancer Maternal Grandmother   . Diabetes Paternal Grandmother   . Stroke Paternal Grandmother   . Stroke Paternal Grandfather   . Diabetes Paternal Grandfather     Social History Social History  Substance Use Topics  . Smoking status: Never Smoker  . Smokeless tobacco: Never Used  . Alcohol use No    Review of Systems Constitutional: Negative for fever/chills Eyes: No visual changes. Left eye pain, swelling, redness and drainage. ENT:  Negative for sore throat and for difficulty swallowing Cardiovascular: Denies chest pain. Respiratory: Denies cough. Denies shortness of breath. Gastrointestinal: No abdominal pain.  No nausea, vomiting, diarrhea. Genitourinary: Negative for dysuria. Musculoskeletal: Negative for back pain. Skin: Negative for rash. Neurological: Negative for headaches.  ____________________________________________  PHYSICAL EXAM:  VITAL SIGNS: ED Triage Vitals  Enc Vitals Group     BP 05/13/17 0824 (!) 138/93     Pulse Rate 05/13/17 0824 (!) 56     Resp 05/13/17 0824 18     Temp 05/13/17 0824 98.5 F (36.9 C)     Temp  Source 05/13/17 0824 Oral     SpO2 05/13/17 0824 97 %     Weight 05/13/17 0825 242 lb (109.8 kg)     Height 05/13/17 0825 5\' 8"  (1.727 m)     Head Circumference --      Peak Flow --      Pain Score 05/13/17 0844 10     Pain Loc --      Pain Edu? --      Excl. in GC? --     Constitutional: Alert and oriented. Well appearing and in no acute distress.  Eyes: Sub-conjunctival hemorrhage left eye. Right eye conjunctiva normal. PERRL. EOMI. No purulent drainage noted on exam. Intact visual acuity.  Head: Normocephalic and atraumatic. ENT:      Ears: Canals clear. TMs intact bilaterally.      Nose: No congestion/rhinnorhea.      Mouth/Throat: Mucous membranes are moist.  Neck:Supple. No thyromegaly. No stridor.  Cardiovascular: Normal rate, regular rhythm. Normal S1 and S2.  Good peripheral circulation. Respiratory: Normal respiratory effort without tachypnea or retractions. Lungs CTAB. No wheezes/rales/rhonchi. Good air entry to the bases with no decreased or absent breath sounds. Hematological/Lymphatic/Immunological: No cervical lymphadenopathy. Cardiovascular: Normal rate, regular rhythm. Normal distal pulses. Gastrointestinal: Bowel sounds 4 quadrants. Soft and nontender to palpation. No guarding or rigidity. No palpable masses. No distention. No CVA tenderness. Musculoskeletal: Nontender with normal range of motion in all extremities. Neurologic: Normal speech and language.  Skin:  Skin is warm, dry and intact. No rash noted. Psychiatric: Mood and affect are normal. Speech and behavior are normal. Patient exhibits appropriate insight and judgement.  ____________________________________________   LABS (all labs ordered are listed, but only abnormal results are displayed)  Labs Reviewed - No data to  display ____________________________________________  EKG none ____________________________________________  RADIOLOGY none ____________________________________________   PROCEDURES  Procedure(s) performed: Fluorescein stain eye exam performed following physical exam:  Performed by: Clois Comber Authorized by: Clois Comber Consent: Verbal consent obtained. Risks and benefits: risks, benefits and alternatives were discussed Consent given by: patient Irrigated left eye with Eye Stream eye wash.  (1) drop Tetracaine instilled followed by instillation of fluorescein dye with fluorescein strip.  Examination with Joseph Art slit lamp performed: No corneal abrasions or defects noted..  Following the exam:  left eye irrigated with Eye Stream and (1) drop of Tetracaine instilled.   Patient tolerance: Patient tolerated the procedure well with no immediate complications    Critical Care performed: no ____________________________________________   INITIAL IMPRESSION / ASSESSMENT AND PLAN / ED COURSE  Pertinent labs & imaging results that were available during my care of the patient were reviewed by me and considered in my medical decision making (see chart for details).  Patient presents to emergency department with left eye pain, swelling, purulent drainage, itchiness and mild blurry vision . History, physical exam findings and findings from fluorescein staining are reassuring symptoms are consistent with bacterial conjunctivitis of the left eye. Patient will be prescribed erythromycin eye ointment for antibiotic coverage. Patient advised to follow up with PCP as needed or return to the emergency department if symptoms return or worsen. Patient informed of clinical course, understand  medical decision-making process, and agree with plan.  ____________________________________________   FINAL CLINICAL IMPRESSION(S) / ED DIAGNOSES  Final diagnoses:  Acute bacterial conjunctivitis of  left eye       NEW MEDICATIONS STARTED DURING THIS VISIT:  Discharge Medication List as of 05/13/2017  9:14 AM    START taking these medications   Details  erythromycin ophthalmic ointment Place 1 application into the left eye 4 (four) times daily., Starting Fri 05/13/2017, Until Wed 05/18/2017, Print         Note:  This document was prepared using Dragon voice recognition software and may include unintentional dictation errors.    Clois Comber, PA-C 05/13/17 1010    Jene Every, MD 05/13/17 1248

## 2018-03-25 ENCOUNTER — Encounter: Payer: Self-pay | Admitting: Medical Oncology

## 2018-03-25 ENCOUNTER — Emergency Department
Admission: EM | Admit: 2018-03-25 | Discharge: 2018-03-25 | Disposition: A | Discharge status: Home / Self Care | Payer: 59 | Attending: Emergency Medicine | Admitting: Emergency Medicine

## 2018-03-25 DIAGNOSIS — M7918 Myalgia, other site: Secondary | ICD-10-CM | POA: Insufficient documentation

## 2018-03-25 DIAGNOSIS — M5489 Other dorsalgia: Secondary | ICD-10-CM | POA: Diagnosis not present

## 2018-03-25 DIAGNOSIS — M79601 Pain in right arm: Secondary | ICD-10-CM | POA: Diagnosis present

## 2018-03-25 MED ORDER — METHOCARBAMOL 500 MG PO TABS: ORAL_TABLET | ORAL | 0 refills | Status: DC

## 2018-03-25 MED ORDER — KETOROLAC TROMETHAMINE 30 MG/ML IJ SOLN
30.0000 mg | Freq: Once | INTRAMUSCULAR | Status: AC
  Administered 2018-03-25: 30 mg via INTRAMUSCULAR
  Filled 2018-03-25: qty 1

## 2018-03-25 NOTE — ED Notes (Signed)
Pt ambulatory to toilet with steady gait.  
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
## 2019-03-13 ENCOUNTER — Emergency Department: Payer: 59

## 2019-03-13 ENCOUNTER — Other Ambulatory Visit: Payer: Self-pay

## 2019-03-13 ENCOUNTER — Emergency Department
Admission: EM | Admit: 2019-03-13 | Discharge: 2019-03-13 | Disposition: A | Discharge status: Home / Self Care | Payer: 59 | Attending: Emergency Medicine | Admitting: Emergency Medicine

## 2019-03-13 DIAGNOSIS — M79605 Pain in left leg: Secondary | ICD-10-CM | POA: Diagnosis not present

## 2019-03-13 DIAGNOSIS — Z79899 Other long term (current) drug therapy: Secondary | ICD-10-CM | POA: Diagnosis not present

## 2019-03-13 DIAGNOSIS — Y999 Unspecified external cause status: Secondary | ICD-10-CM | POA: Diagnosis not present

## 2019-03-13 DIAGNOSIS — R0789 Other chest pain: Secondary | ICD-10-CM | POA: Diagnosis not present

## 2019-03-13 DIAGNOSIS — M79602 Pain in left arm: Secondary | ICD-10-CM | POA: Diagnosis not present

## 2019-03-13 DIAGNOSIS — S0093XA Contusion of unspecified part of head, initial encounter: Secondary | ICD-10-CM

## 2019-03-13 DIAGNOSIS — Y9389 Activity, other specified: Secondary | ICD-10-CM | POA: Insufficient documentation

## 2019-03-13 DIAGNOSIS — Y92410 Unspecified street and highway as the place of occurrence of the external cause: Secondary | ICD-10-CM | POA: Insufficient documentation

## 2019-03-13 DIAGNOSIS — S0990XA Unspecified injury of head, initial encounter: Secondary | ICD-10-CM | POA: Diagnosis present

## 2019-03-13 MED ORDER — HYDROCODONE-ACETAMINOPHEN 5-325 MG PO TABS
2.0000 | ORAL_TABLET | Freq: Once | ORAL | Status: AC
  Administered 2019-03-13: 2 via ORAL
  Filled 2019-03-13: qty 2
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
## 2019-03-13 NOTE — Discharge Instructions (Signed)

## 2019-11-24 ENCOUNTER — Other Ambulatory Visit: Payer: Self-pay

## 2019-11-24 ENCOUNTER — Emergency Department
Admission: EM | Admit: 2019-11-24 | Discharge: 2019-11-24 | Disposition: A | Discharge status: Home / Self Care | Payer: 59 | Attending: Emergency Medicine | Admitting: Emergency Medicine

## 2019-11-24 ENCOUNTER — Emergency Department: Payer: 59

## 2019-11-24 ENCOUNTER — Encounter: Payer: Self-pay | Admitting: Emergency Medicine

## 2019-11-24 DIAGNOSIS — M542 Cervicalgia: Secondary | ICD-10-CM

## 2019-11-24 DIAGNOSIS — Q760 Spina bifida occulta: Secondary | ICD-10-CM | POA: Insufficient documentation

## 2019-11-24 DIAGNOSIS — M5412 Radiculopathy, cervical region: Secondary | ICD-10-CM

## 2019-11-24 DIAGNOSIS — R002 Palpitations: Secondary | ICD-10-CM | POA: Insufficient documentation

## 2019-11-24 DIAGNOSIS — R0602 Shortness of breath: Secondary | ICD-10-CM | POA: Diagnosis not present

## 2019-11-24 DIAGNOSIS — R0789 Other chest pain: Secondary | ICD-10-CM

## 2019-11-24 DIAGNOSIS — M419 Scoliosis, unspecified: Secondary | ICD-10-CM | POA: Insufficient documentation

## 2019-11-24 LAB — CBC
HCT: 46.8 % (ref 39.0–52.0)
Hemoglobin: 16.2 g/dL (ref 13.0–17.0)
MCH: 29.6 pg (ref 26.0–34.0)
MCHC: 34.6 g/dL (ref 30.0–36.0)
MCV: 85.4 fL (ref 80.0–100.0)
Platelets: 317 10*3/uL (ref 150–400)
RBC: 5.48 MIL/uL (ref 4.22–5.81)
RDW: 12.4 % (ref 11.5–15.5)
WBC: 9.3 10*3/uL (ref 4.0–10.5)
nRBC: 0 % (ref 0.0–0.2)

## 2019-11-24 LAB — BASIC METABOLIC PANEL
Anion gap: 7 (ref 5–15)
BUN: 6 mg/dL (ref 6–20)
CO2: 25 mmol/L (ref 22–32)
Calcium: 8.8 mg/dL — ABNORMAL LOW (ref 8.9–10.3)
Chloride: 106 mmol/L (ref 98–111)
Creatinine, Ser: 0.42 mg/dL — ABNORMAL LOW (ref 0.61–1.24)
GFR calc Af Amer: >60 mL/min (ref >60)
GFR calc non Af Amer: >60 mL/min (ref >60)
Glucose, Bld: 89 mg/dL (ref 70–99)
Potassium: 3.7 mmol/L (ref 3.5–5.1)
Sodium: 138 mmol/L (ref 135–145)

## 2019-11-24 LAB — TROPONIN I (HIGH SENSITIVITY)
Troponin I (High Sensitivity): <2 ng/L (ref <18)
Troponin I (High Sensitivity): <2 ng/L (ref <18)

## 2019-11-24 MED ORDER — NAPROXEN 375 MG PO TABS: 375.0000 mg | ORAL_TABLET | Freq: Two times a day (BID) | ORAL | 0 refills | Status: AC

## 2019-11-24 MED ORDER — SODIUM CHLORIDE 0.9% FLUSH: 3.0000 mL | Freq: Once | INTRAVENOUS | Status: DC

## 2019-11-24 MED ORDER — PREDNISONE 20 MG PO TABS: 40.0000 mg | ORAL_TABLET | Freq: Every day | ORAL | 0 refills | Status: AC

## 2019-11-24 NOTE — ED Notes (Signed)
Pt reports they are having chest pain and tightness that radiates down the left arm starting a couple of days ago and decided to come to the ED today because of numbness in the left arm.  Pt states he has had anxiety attacks in the past with chest tightness, but this feels different because of the radiation and numbness in  the arm.

## 2019-11-24 NOTE — Discharge Instructions (Signed)
As we discussed, I suspect your neck, chest, and arm symptoms were due to a pinched nerve in your back.  This is likely work-related.  For now, we will start a steroid as well as scheduled naproxen for the next 5 days.  Do not take Advil or Aleve when taking the naproxen.  He can take Tylenol for additional pain.  The steroid should help your symptoms within 24 hours.  Take the medications with food.  Take an antacid if you develop some indigestion.  Do not lift anything greater than 10 pounds for the next week.

## 2019-11-24 NOTE — ED Triage Notes (Signed)
PT to ER with c/o left sided chest pain that radiates in left arm.  PT also states left arm numbness.  Pt states pain started 2 days ago and numbness today.  Pt states SHOB, denies n/v.  Pt states pain worsens with movement of left arm.

## 2019-11-24 NOTE — ED Provider Notes (Signed)
[facility] Emergency Department Provider Note  ____________________________________________   First MD Initiated Contact with Patient 11/24/19 2132     (approximate)  I have reviewed the triage vital signs and the nursing notes.   HISTORY  Chief Complaint Chest Pain    HPI Kerry Ford is a 24 y.o. male  With PMHx IBS, cervical radiculitis here with left shoulder and left arm pain. Pt reports he awoke from a nap today with pain in his left upper shoulder and upper chest, with a feeling like there was a muscle pulling in his left upper chest/shoulder. He then experienced a numbness/tingling sensation down his arm and onto the lateral aspect of his hand and fingers, involving the 3-5th fingers. He felt a cramping in his hand then sx resolved. He began to feel anxious about his pain and then noticed he was SOB and felt like he was having palpitations. Sx then resolved. No h/o CAD. No family h/o early CAD. Denies tobacco use. No leg swelling. No specific alleviating factors.   Past Medical History:  Diagnosis Date  . Allergy   . Bilateral headaches   . Chronic back pain   . Idiopathic scoliosis   . Irritable bowel   . Left hip pain   . Low testosterone   . Radiculitis, cervical   . Scintillating scotoma    when he is stressed  . Snoring   . Spina bifida occulta     Patient Active Problem List   Diagnosis Date Noted  . Snores 06/07/2015  . Hypotestosteronism 06/07/2015  . Bilateral headache 06/07/2015  . IBS (irritable bowel syndrome) 06/06/2015  . Childhood obesity 06/06/2015  . Seasonal allergic rhinitis 06/06/2015  . Spina bifida occulta 06/06/2015  . Chronic back pain 06/06/2015  . Scoliosis, idiopathic, infantile 06/06/2015  . 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 Trochanteric bursitis of left hip 06/06/2015  . Breast development in males 08/31/2013  . Iliotibial band syndrome 12/22/2011  . Back pain, chronic 05/23/2009    Past  Surgical History:  Procedure Laterality Date  . TONSILLECTOMY AND ADENOIDECTOMY    . TYMPANOSTOMY TUBE PLACEMENT      Prior to Admission medications   Medication Sig Start Date End Date Taking? Authorizing Provider  methocarbamol (ROBAXIN) 500 MG tablet 1 or 2 tablets every 6 hours as needed for muscle spasms. 03/25/18   Johnn Hai, PA-C  naproxen (NAPROSYN) 375 MG tablet Take 1 tablet (375 mg total) by mouth 2 (two) times daily with a meal for 5 days. 11/24/19 11/29/19  Duffy Bruce, MD  omeprazole (PRILOSEC) 20 MG capsule Take 1 capsule (20 mg total) by mouth daily. 04/12/16 04/12/17  Lavonia Drafts, MD  predniSONE (DELTASONE) 20 MG tablet Take 2 tablets (40 mg total) by mouth daily for 5 days. 11/24/19 11/29/19  Duffy Bruce, MD  sucralfate (CARAFATE) 1 g tablet Take 1 tablet (1 g total) by mouth 4 (four) times daily. 04/12/16 04/12/17  Lavonia Drafts, MD    Allergies Wasp venom  Family History  Problem Relation Age of Onset  . Hypothyroidism Mother   . Diabetes Mother   . Stroke Father   . Cancer Maternal Grandmother   . Diabetes Paternal Grandmother   . Stroke Paternal Grandmother   . Stroke Paternal Grandfather   . Diabetes Paternal Grandfather     Social History Social History   Tobacco Use  . Smoking status: Never Smoker  . Smokeless tobacco: Never Used  Substance Use Topics  . Alcohol  use: No    Alcohol/week: 0.0 standard drinks  . Drug use: No    Review of Systems  Review of Systems  Constitutional: Negative for chills, fatigue and fever.  HENT: Negative for sore throat.   Respiratory: Negative for shortness of breath.   Cardiovascular: Positive for chest pain.  Gastrointestinal: Negative for abdominal pain.  Genitourinary: Negative for flank pain.  Musculoskeletal: Positive for neck pain.  Skin: Negative for rash and wound.  Allergic/Immunologic: Negative for immunocompromised state.  Neurological: Negative for weakness and numbness.  Hematological:  Does not bruise/bleed easily.     ____________________________________________  PHYSICAL EXAM:      VITAL SIGNS: ED Triage Vitals [11/24/19 1631]  Enc Vitals Group     BP 118/75     Pulse Rate 72     Resp 18     Temp 98.1 F (36.7 C)     Temp Source Oral     SpO2 98 %     Weight 250 lb (113.4 kg)     Height 5\' 6"  (1.676 m)     Head Circumference      Peak Flow      Pain Score 8     Pain Loc      Pain Edu?      Excl. in GC?      Physical Exam Vitals and nursing note reviewed.  Constitutional:      General: He is not in acute distress.    Appearance: He is well-developed.  HENT:     Head: Normocephalic and atraumatic.  Eyes:     Conjunctiva/sclera: Conjunctivae normal.  Neck:     Comments: Moderate TTP left lateral neck, with +Spurling's Cardiovascular:     Rate and Rhythm: Normal rate and regular rhythm.     Heart sounds: Normal heart sounds.  Pulmonary:     Effort: Pulmonary effort is normal. No respiratory distress.     Breath sounds: No wheezing.  Abdominal:     General: There is no distension.  Musculoskeletal:     Cervical back: Neck supple.  Skin:    General: Skin is warm.     Capillary Refill: Capillary refill takes less than 2 seconds.     Findings: No rash.  Neurological:     Mental Status: He is alert and oriented to person, place, and time.     Motor: No abnormal muscle tone.       ____________________________________________   LABS (all labs ordered are listed, but only abnormal results are displayed)  Labs Reviewed  BASIC METABOLIC PANEL - Abnormal; Notable for the following components:      Result Value   Creatinine, Ser 0.42 (*)    Calcium 8.8 (*)    All other components within normal limits  CBC  TROPONIN I (HIGH SENSITIVITY)  TROPONIN I (HIGH SENSITIVITY)    ____________________________________________  EKG: Normal sinus rhythm, VR 73. PR 152, QRS 84, QTc 425. No acute ST elevations or depressions. No ischemia or  infarct. ________________________________________  RADIOLOGY All imaging, including plain films, CT scans, and ultrasounds, independently reviewed by me, and interpretations confirmed via formal radiology reads.  ED MD interpretation:   CXR: Clear, no focal abnormality  Official radiology report(s): DG Chest 2 View  Result Date: 11/24/2019 CLINICAL DATA:  Chest pain EXAM: CHEST - 2 VIEW COMPARISON:  03/13/2019 FINDINGS: The heart size and mediastinal contours are within normal limits. Both lungs are clear. The visualized skeletal structures are unremarkable. IMPRESSION: No acute abnormality of the  lungs. Electronically Signed   By: Lauralyn Primes M.D.   On: 11/24/2019 17:21    ____________________________________________  PROCEDURES   Procedure(s) performed (including Critical Care):  Procedures  ____________________________________________  INITIAL IMPRESSION / MDM / ASSESSMENT AND PLAN / ED COURSE  As part of my medical decision making, I reviewed the following data within the electronic MEDICAL RECORD NUMBER Nursing notes reviewed and incorporated, Old chart reviewed, Notes from prior ED visits, and [place] Controlled Substance Database       *Kerry Ford was evaluated in Emergency Department on 11/25/2019 for the symptoms described in the history of present illness. He was evaluated in the context of the global COVID-19 pandemic, which necessitated consideration that the patient might be at risk for infection with the SARS-CoV-2 virus that causes COVID-19. Institutional protocols and algorithms that pertain to the evaluation of patients at risk for COVID-19 are in a state of rapid change based on information released by regulatory bodies including the CDC and federal and state organizations. These policies and algorithms were followed during the patient's care in the ED.  Some ED evaluations and interventions may be delayed as a result of limited staffing during the pandemic.*     Medical  Decision Making:  24 yo M here with transient left upper chest/shoulder pain and numbness down L arm. I suspect pt's primary issue is intermittent cervical radiculopathy. He has chronic neck pain from working and lifting at work, and he has moderate L cervical paraspinal TTP with +Spurling's on left reproducing his paresthesias along C6-C8 distribution. No signs of significant UE weakness, numbness, or cord compression but will tx symptomatically for this. The relation with his neck positioning while sleeping is also consistent with this. No midline or bony TTP, feel imaging would not be beneficial at this point. Otherwise, his CP could be referred from this or 2/2 anxiety related to his pain. EKG is nonischemic, trop neg x 2 and I do not suspect ACS. He has no s/s to suggest PE, dissection. He is not tachycardic, tachypneic, hypertensive or hypoxic. Labs o/w reassuring.  Will treat for his likely cervical radiculopathy, and refer for outpt follow-up. Return precautions given.  ____________________________________________  FINAL CLINICAL IMPRESSION(S) / ED DIAGNOSES  Final diagnoses:  Cervical radiculopathy  Cervical pain (neck)  Atypical chest pain     MEDICATIONS GIVEN DURING THIS VISIT:  Medications  sodium chloride flush (NS) 0.9 % injection 3 mL (has no administration in time range)     ED Discharge Orders         Ordered    predniSONE (DELTASONE) 20 MG tablet  Daily     11/24/19 2154    naproxen (NAPROSYN) 375 MG tablet  2 times daily with meals     11/24/19 2154           Note:  This document was prepared using Dragon voice recognition software and may include unintentional dictation errors.   Shaune Pollack, MD 11/25/19 [phone]

## 2020-03-15 ENCOUNTER — Emergency Department: Payer: BC Managed Care – PPO

## 2020-03-15 ENCOUNTER — Emergency Department
Admission: EM | Admit: 2020-03-15 | Discharge: 2020-03-15 | Disposition: A | Discharge status: Home / Self Care | Payer: BC Managed Care – PPO | Attending: Emergency Medicine | Admitting: Emergency Medicine

## 2020-03-15 ENCOUNTER — Other Ambulatory Visit: Payer: Self-pay

## 2020-03-15 ENCOUNTER — Encounter: Payer: Self-pay | Admitting: Emergency Medicine

## 2020-03-15 DIAGNOSIS — M545 Low back pain: Secondary | ICD-10-CM | POA: Diagnosis not present

## 2020-03-15 DIAGNOSIS — S161XXA Strain of muscle, fascia and tendon at neck level, initial encounter: Secondary | ICD-10-CM

## 2020-03-15 DIAGNOSIS — M549 Dorsalgia, unspecified: Secondary | ICD-10-CM

## 2020-03-15 DIAGNOSIS — R109 Unspecified abdominal pain: Secondary | ICD-10-CM | POA: Diagnosis not present

## 2020-03-15 DIAGNOSIS — S161XXD Strain of muscle, fascia and tendon at neck level, subsequent encounter: Secondary | ICD-10-CM | POA: Insufficient documentation

## 2020-03-15 DIAGNOSIS — Z79899 Other long term (current) drug therapy: Secondary | ICD-10-CM | POA: Diagnosis not present

## 2020-03-15 LAB — COMPREHENSIVE METABOLIC PANEL
ALT: 21 U/L (ref 0–44)
AST: 21 U/L (ref 15–41)
Albumin: 4 g/dL (ref 3.5–5.0)
Alkaline Phosphatase: 64 U/L (ref 38–126)
Anion gap: 9 (ref 5–15)
BUN: 9 mg/dL (ref 6–20)
CO2: 27 mmol/L (ref 22–32)
Calcium: 8.8 mg/dL — ABNORMAL LOW (ref 8.9–10.3)
Chloride: 101 mmol/L (ref 98–111)
Creatinine, Ser: 0.7 mg/dL (ref 0.61–1.24)
GFR calc Af Amer: >60 mL/min (ref >60)
GFR calc non Af Amer: >60 mL/min (ref >60)
Glucose, Bld: 93 mg/dL (ref 70–99)
Potassium: 3.3 mmol/L — ABNORMAL LOW (ref 3.5–5.1)
Sodium: 137 mmol/L (ref 135–145)
Total Bilirubin: 2 mg/dL — ABNORMAL HIGH (ref 0.3–1.2)
Total Protein: 7.6 g/dL (ref 6.5–8.1)

## 2020-03-15 LAB — CBC WITH DIFFERENTIAL/PLATELET
Abs Immature Granulocytes: 0.04 10*3/uL (ref 0.00–0.07)
Basophils Absolute: 0.1 10*3/uL (ref 0.0–0.1)
Basophils Relative: 1 %
Eosinophils Absolute: 0.1 10*3/uL (ref 0.0–0.5)
Eosinophils Relative: 0 %
HCT: 43.7 % (ref 39.0–52.0)
Hemoglobin: 15.6 g/dL (ref 13.0–17.0)
Immature Granulocytes: 0 %
Lymphocytes Relative: 25 %
Lymphs Abs: 2.9 10*3/uL (ref 0.7–4.0)
MCH: 29.9 pg (ref 26.0–34.0)
MCHC: 35.7 g/dL (ref 30.0–36.0)
MCV: 83.9 fL (ref 80.0–100.0)
Monocytes Absolute: 1 10*3/uL (ref 0.1–1.0)
Monocytes Relative: 9 %
Neutro Abs: 7.8 10*3/uL — ABNORMAL HIGH (ref 1.7–7.7)
Neutrophils Relative %: 65 %
Platelets: 357 10*3/uL (ref 150–400)
RBC: 5.21 MIL/uL (ref 4.22–5.81)
RDW: 12.4 % (ref 11.5–15.5)
WBC: 11.9 10*3/uL — ABNORMAL HIGH (ref 4.0–10.5)
nRBC: 0 % (ref 0.0–0.2)

## 2020-03-15 LAB — URINALYSIS, COMPLETE (UACMP) WITH MICROSCOPIC
Bacteria, UA: NONE SEEN
Bilirubin Urine: NEGATIVE
Glucose, UA: NEGATIVE mg/dL
Hgb urine dipstick: NEGATIVE
Ketones, ur: NEGATIVE mg/dL
Nitrite: NEGATIVE
Protein, ur: 30 mg/dL — AB
Specific Gravity, Urine: 1.033 — ABNORMAL HIGH (ref 1.005–1.030)
Squamous Epithelial / HPF: NONE SEEN (ref 0–5)
pH: 5 (ref 5.0–8.0)

## 2020-03-15 MED ORDER — PREDNISONE 10 MG (21) PO TBPK: ORAL_TABLET | ORAL | 0 refills | Status: DC

## 2020-03-15 MED ORDER — IOHEXOL 300 MG/ML  SOLN
125.0000 mL | Freq: Once | INTRAMUSCULAR | Status: AC | PRN
  Administered 2020-03-15: 125 mL via INTRAVENOUS
  Filled 2020-03-15: qty 125

## 2020-03-15 MED ORDER — BACLOFEN 10 MG PO TABS: 10.0000 mg | ORAL_TABLET | Freq: Three times a day (TID) | ORAL | 0 refills | Status: AC

## 2020-03-15 NOTE — ED Notes (Signed)
Patient states he has scoliosis and spina bifida and didn't receive a CT scan of spine at the hospital in [place], but did receive a chest x-ray. Patient states he fell over when getting out of bed this morning due to pain in right leg.
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
## 2020-03-15 NOTE — Discharge Instructions (Addendum)
Follow-up with your regular doctor if not improving in 3 to 4 days.  Take medication as prescribed.  Return to ER if worsening.

## 2020-07-15 ENCOUNTER — Ambulatory Visit (LOCAL_COMMUNITY_HEALTH_CENTER): Payer: Self-pay

## 2020-07-15 ENCOUNTER — Other Ambulatory Visit: Payer: Self-pay

## 2020-07-15 DIAGNOSIS — Z111 Encounter for screening for respiratory tuberculosis: Secondary | ICD-10-CM

## 2020-07-18 ENCOUNTER — Other Ambulatory Visit: Payer: 59

## 2020-07-18 ENCOUNTER — Ambulatory Visit (LOCAL_COMMUNITY_HEALTH_CENTER): Payer: 59

## 2020-07-18 ENCOUNTER — Telehealth: Payer: Self-pay

## 2020-07-18 ENCOUNTER — Other Ambulatory Visit: Payer: Self-pay

## 2020-07-18 DIAGNOSIS — Z111 Encounter for screening for respiratory tuberculosis: Secondary | ICD-10-CM

## 2020-07-18 LAB — TB SKIN TEST
Induration: 0 mm
TB Skin Test: NEGATIVE

## 2020-07-18 NOTE — Telephone Encounter (Signed)
Client DNKA this am for PPDR. Call to client who reports is having transportation problems and requested appt be rescheduled to 3:00 pm. Per client, there is nothing on his arm where PPD was given. Jossie Ng, RN
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
## 2022-06-30 ENCOUNTER — Other Ambulatory Visit: Payer: Self-pay

## 2022-06-30 ENCOUNTER — Ambulatory Visit (HOSPITAL_COMMUNITY)
Admission: EM | Admit: 2022-06-30 | Discharge: 2022-06-30 | Discharge status: Against Medical Advice | Payer: No Payment, Other

## 2022-06-30 ENCOUNTER — Emergency Department (HOSPITAL_COMMUNITY)
Admission: EM | Admit: 2022-06-30 | Discharge: 2022-06-30 | Disposition: A | Discharge status: Home / Self Care | Payer: No Typology Code available for payment source | Attending: Emergency Medicine | Admitting: Emergency Medicine

## 2022-06-30 ENCOUNTER — Encounter (HOSPITAL_COMMUNITY): Payer: Self-pay

## 2022-06-30 DIAGNOSIS — F419 Anxiety disorder, unspecified: Secondary | ICD-10-CM | POA: Insufficient documentation

## 2022-06-30 DIAGNOSIS — F32A Depression, unspecified: Secondary | ICD-10-CM | POA: Diagnosis not present
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
## 2022-06-30 NOTE — ED Triage Notes (Signed)
BIBA from home with anxiety d/t increased stress.  Identifies as "she" Reports no meds for 2 years

## 2022-06-30 NOTE — BH Assessment (Addendum)
Pt reports having a panic attack this morning (difficulty breathing, heart palpitations, loss of balance, blurred vision) after argument with significant other about their finances. Pt went to [facility] for treatment and was given the option to come here due to wait time. Pt hopes to receive medications. Pt denies SI, HI, AVH and substance use.   Pt left AMA

## 2022-11-01 ENCOUNTER — Emergency Department (HOSPITAL_COMMUNITY): Payer: No Typology Code available for payment source

## 2022-11-01 ENCOUNTER — Encounter (HOSPITAL_COMMUNITY): Payer: Self-pay | Admitting: Emergency Medicine

## 2022-11-01 ENCOUNTER — Other Ambulatory Visit: Payer: Self-pay

## 2022-11-01 ENCOUNTER — Emergency Department (HOSPITAL_COMMUNITY)
Admission: EM | Admit: 2022-11-01 | Discharge: 2022-11-02 | Disposition: A | Discharge status: Home / Self Care | Payer: No Typology Code available for payment source | Attending: Emergency Medicine | Admitting: Emergency Medicine

## 2022-11-01 DIAGNOSIS — H3563 Retinal hemorrhage, bilateral: Secondary | ICD-10-CM | POA: Insufficient documentation

## 2022-11-01 DIAGNOSIS — R4781 Slurred speech: Secondary | ICD-10-CM | POA: Insufficient documentation

## 2022-11-01 DIAGNOSIS — J019 Acute sinusitis, unspecified: Secondary | ICD-10-CM | POA: Diagnosis not present

## 2022-11-01 DIAGNOSIS — S61213A Laceration without foreign body of left middle finger without damage to nail, initial encounter: Secondary | ICD-10-CM

## 2022-11-01 DIAGNOSIS — W260XXA Contact with knife, initial encounter: Secondary | ICD-10-CM | POA: Insufficient documentation

## 2022-11-01 DIAGNOSIS — Z23 Encounter for immunization: Secondary | ICD-10-CM | POA: Insufficient documentation

## 2022-11-01 LAB — CBC WITH DIFFERENTIAL/PLATELET
Abs Immature Granulocytes: 0.06 10*3/uL (ref 0.00–0.07)
Basophils Absolute: 0 10*3/uL (ref 0.0–0.1)
Basophils Relative: 0 %
Eosinophils Absolute: 0.1 10*3/uL (ref 0.0–0.5)
Eosinophils Relative: 1 %
HCT: 39.2 % (ref 39.0–52.0)
Hemoglobin: 13.2 g/dL (ref 13.0–17.0)
Immature Granulocytes: 1 %
Lymphocytes Relative: 21 %
Lymphs Abs: 2.2 10*3/uL (ref 0.7–4.0)
MCH: 29.2 pg (ref 26.0–34.0)
MCHC: 33.7 g/dL (ref 30.0–36.0)
MCV: 86.7 fL (ref 80.0–100.0)
Monocytes Absolute: 0.8 10*3/uL (ref 0.1–1.0)
Monocytes Relative: 8 %
Neutro Abs: 7.6 10*3/uL (ref 1.7–7.7)
Neutrophils Relative %: 69 %
Platelets: 385 10*3/uL (ref 150–400)
RBC: 4.52 MIL/uL (ref 4.22–5.81)
RDW: 12.5 % (ref 11.5–15.5)
WBC: 10.8 10*3/uL — ABNORMAL HIGH (ref 4.0–10.5)
nRBC: 0 % (ref 0.0–0.2)

## 2022-11-01 LAB — COMPREHENSIVE METABOLIC PANEL
ALT: 15 U/L (ref 0–44)
AST: 18 U/L (ref 15–41)
Albumin: 3.3 g/dL — ABNORMAL LOW (ref 3.5–5.0)
Alkaline Phosphatase: 46 U/L (ref 38–126)
Anion gap: 11 (ref 5–15)
BUN: 8 mg/dL (ref 6–20)
CO2: 24 mmol/L (ref 22–32)
Calcium: 8.4 mg/dL — ABNORMAL LOW (ref 8.9–10.3)
Chloride: 103 mmol/L (ref 98–111)
Creatinine, Ser: 0.62 mg/dL (ref 0.61–1.24)
GFR, Estimated: >60 mL/min (ref >60)
Glucose, Bld: 91 mg/dL (ref 70–99)
Potassium: 3.1 mmol/L — ABNORMAL LOW (ref 3.5–5.1)
Sodium: 138 mmol/L (ref 135–145)
Total Bilirubin: 1.4 mg/dL — ABNORMAL HIGH (ref 0.3–1.2)
Total Protein: 6.6 g/dL (ref 6.5–8.1)

## 2022-11-01 LAB — CBG MONITORING, ED: Glucose-Capillary: 89 mg/dL (ref 70–99)

## 2022-11-01 LAB — ETHANOL: Alcohol, Ethyl (B): <10 mg/dL (ref <10)

## 2022-11-01 MED ORDER — SODIUM CHLORIDE 0.9 % IV BOLUS
1000.0000 mL | Freq: Once | INTRAVENOUS | Status: AC
  Administered 2022-11-01: 1000 mL via INTRAVENOUS

## 2022-11-01 MED ORDER — TETANUS-DIPHTH-ACELL PERTUSSIS 5-2.5-18.5 LF-MCG/0.5 IM SUSY
0.5000 mL | PREFILLED_SYRINGE | Freq: Once | INTRAMUSCULAR | Status: AC
  Administered 2022-11-01: 0.5 mL via INTRAMUSCULAR
  Filled 2022-11-01: qty 0.5

## 2022-11-01 MED ORDER — LIDOCAINE HCL (PF) 1 % IJ SOLN
5.0000 mL | Freq: Once | INTRAMUSCULAR | Status: AC
  Administered 2022-11-01: 5 mL
  Filled 2022-11-01: qty 30

## 2022-11-01 MED ORDER — LIDOCAINE HCL (PF) 1 % IJ SOLN
30.0000 mL | Freq: Once | INTRAMUSCULAR | Status: AC
  Administered 2022-11-02: 30 mL
  Filled 2022-11-01: qty 30

## 2022-11-01 MED ORDER — SODIUM CHLORIDE 0.9 % IV SOLN: INTRAVENOUS | Status: DC
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
## 2022-11-01 NOTE — ED Provider Notes (Signed)
..  Laceration Repair  Date/Time: 11/01/2022 6:54 PM  Performed by: Chuck Hint, PA-C Authorized by: Chuck Hint, PA-C   Consent:    Consent obtained:  Verbal   Consent given by:  Patient   Risks, benefits, and alternatives were discussed: yes     Risks discussed:  Infection, pain, nerve damage and need for additional repair Universal protocol:    Test results available: yes     Patient identity confirmed:  Verbally with patient Anesthesia:    Anesthesia method:  Local infiltration   Local anesthetic:  Lidocaine 1% w/o epi Laceration details:    Location:  Finger   Finger location:  L long finger   Length (cm):  1   Depth (mm):  3  Patient was given digital block in triage for pain relief. Laceration was not repaired at this time as triage was closing and patient was sent back to waiting room.   Chuck Hint, PA-C 11/01/22 Kerry Ford    Kerry Hose, MD 11/02/22 [phone]

## 2022-11-02 ENCOUNTER — Encounter (HOSPITAL_COMMUNITY): Payer: Self-pay | Admitting: Emergency Medicine

## 2022-11-02 LAB — AMMONIA: Ammonia: 34 umol/L (ref 9–35)

## 2022-11-02 MED ORDER — AMOXICILLIN-POT CLAVULANATE 875-125 MG PO TABS: 1.0000 | ORAL_TABLET | Freq: Two times a day (BID) | ORAL | 0 refills | Status: DC

## 2022-11-02 MED ORDER — AMOXICILLIN-POT CLAVULANATE 875-125 MG PO TABS
1.0000 | ORAL_TABLET | Freq: Once | ORAL | Status: AC
  Administered 2022-11-02: 1 via ORAL
  Filled 2022-11-02: qty 1

## 2022-11-02 MED ORDER — LIDOCAINE-EPINEPHRINE (PF) 2 %-1:200000 IJ SOLN
10.0000 mL | Freq: Once | INTRAMUSCULAR | Status: AC
  Administered 2022-11-02: 10 mL
  Filled 2022-11-02: qty 20

## 2022-11-02 MED ORDER — SODIUM CHLORIDE 0.9 % IV BOLUS
1000.0000 mL | Freq: Once | INTRAVENOUS | Status: AC
  Administered 2022-11-02: 1000 mL via INTRAVENOUS

## 2022-11-02 MED ORDER — POTASSIUM CHLORIDE CRYS ER 20 MEQ PO TBCR
40.0000 meq | EXTENDED_RELEASE_TABLET | Freq: Once | ORAL | Status: AC
  Administered 2022-11-02: 40 meq via ORAL
  Filled 2022-11-02: qty 2

## 2022-11-02 NOTE — Discharge Instructions (Addendum)
Follow-up with behavioral health urgent care as needed. Take antibiotics as prescribed for sinus infection and complete the full course.

## 2023-12-13 ENCOUNTER — Emergency Department
Admission: EM | Admit: 2023-12-13 | Discharge: 2023-12-13 | Disposition: A | Discharge status: Home / Self Care | Attending: Emergency Medicine | Admitting: Emergency Medicine

## 2023-12-13 ENCOUNTER — Other Ambulatory Visit: Payer: Self-pay

## 2023-12-13 ENCOUNTER — Emergency Department

## 2023-12-13 ENCOUNTER — Encounter: Payer: Self-pay | Admitting: Emergency Medicine

## 2023-12-13 DIAGNOSIS — R509 Fever, unspecified: Secondary | ICD-10-CM | POA: Diagnosis present

## 2023-12-13 DIAGNOSIS — J069 Acute upper respiratory infection, unspecified: Secondary | ICD-10-CM | POA: Diagnosis not present

## 2023-12-13 DIAGNOSIS — B9789 Other viral agents as the cause of diseases classified elsewhere: Secondary | ICD-10-CM

## 2023-12-13 LAB — RESP PANEL BY RT-PCR (RSV, FLU A&B, COVID)  RVPGX2
Influenza A by PCR: NEGATIVE
Influenza B by PCR: NEGATIVE
Resp Syncytial Virus by PCR: NEGATIVE
SARS Coronavirus 2 by RT PCR: NEGATIVE

## 2023-12-13 LAB — GROUP A STREP BY PCR: Group A Strep by PCR: NOT DETECTED

## 2023-12-13 MED ORDER — AZITHROMYCIN 250 MG PO TABS: 250.0000 mg | ORAL_TABLET | Freq: Every day | ORAL | 0 refills | Status: AC

## 2023-12-13 MED ORDER — AZITHROMYCIN 500 MG PO TABS
500.0000 mg | ORAL_TABLET | Freq: Once | ORAL | Status: AC
  Administered 2023-12-13: 500 mg via ORAL

## 2023-12-13 MED ORDER — AZITHROMYCIN 500 MG PO TABS
ORAL_TABLET | ORAL | Status: AC
  Filled 2023-12-13: qty 1

## 2023-12-13 NOTE — ED Provider Notes (Signed)
 [facility] Emergency Department Provider Note     Event Date/Time   First MD Initiated Contact with Patient 12/13/23 1955     (approximate)   History   Fever   HPI  Kerry Ford is a 28 y.o. adult with a history of IBS, and low testosterone, who presents to the ED reporting flulike symptoms including body aches, hot and cold flashes, and fatigue.  Patient notes a Tmax yesterday 102 F.  Patient would endorse 1 week of symptoms.  He denies any bladder or bowel changes, sick contacts, or recent travel.  Physical Exam   Triage Vital Signs: ED Triage Vitals  Encounter Vitals Group     BP 12/13/23 1851 (!) 125/91     Systolic BP Percentile --      Diastolic BP Percentile --      Pulse Rate 12/13/23 1851 (!) 105     Resp 12/13/23 1851 18     Temp 12/13/23 1851 98.3 F (36.8 C)     Temp src --      SpO2 12/13/23 1851 95 %     Weight 12/13/23 1850 250 lb (113.4 kg)     Height 12/13/23 1850 5\' 6"  (1.676 m)     Head Circumference --      Peak Flow --      Pain Score 12/13/23 1850 7     Pain Loc --      Pain Education --      Exclude from Growth Chart --     Most recent vital signs: Vitals:   12/13/23 1851 12/13/23 2157  BP: (!) 125/91 (!) 123/90  Pulse: (!) 105 98  Resp: 18 18  Temp: 98.3 F (36.8 C)   SpO2: 95% 100%    General Awake, no distress. NAD HEENT NCAT. PERRL. EOMI. No rhinorrhea. Mucous membranes are moist.  CV:  Good peripheral perfusion. RRR RESP:  Normal effort. CTA ABD:  No distention.    ED Results / Procedures / Treatments   Labs (all labs ordered are listed, but only abnormal results are displayed) Labs Reviewed  RESP PANEL BY RT-PCR (RSV, FLU A&B, COVID)  [id number]  GROUP A STREP BY PCR    EKG   RADIOLOGY  I personally viewed and evaluated these images as part of my medical decision making, as well as reviewing the written report by the radiologist.  ED Provider Interpretation: No acute findings  DG Chest  2 View Result Date: 12/13/2023 CLINICAL DATA:  Cough and fever.  Flu like symptoms. EXAM: CHEST - 2 VIEW COMPARISON:  11/24/2019 FINDINGS: The cardiomediastinal contours are normal. The lungs are clear. Pulmonary vasculature is normal. No consolidation, pleural effusion, or pneumothorax. Similar thoracic scoliosis. No acute osseous abnormalities are seen. IMPRESSION: No acute findings. Electronically Signed   By: Narda Rutherford M.D.   On: 12/13/2023 21:03    PROCEDURES:  Critical Care performed: No  Procedures   MEDICATIONS ORDERED IN ED: Medications  azithromycin (ZITHROMAX) 500 MG tablet (has no administration in time range)  azithromycin (ZITHROMAX) tablet 500 mg (500 mg Oral Given 12/13/23 2156)     IMPRESSION / MDM / ASSESSMENT AND PLAN / ED COURSE  I reviewed the triage vital signs and the nursing notes.                              Differential diagnosis includes, but is not limited to, COVID, flu, RSV, viral  URI, CAP, bronchitis, viral gastroenteritis, strep pharyngitis  Patient's presentation is most consistent with acute presentation with potential threat to life or bodily function.  Patient's diagnosis is consistent with viral upper respiratory infection.  Patient with recent exam workup at this time.  Viral panel test is negative as is strep PCR.  Chest x-ray does not reveal any acute consolidations.  Patient will be discharged home with prescriptions for azithromycin. Patient is to follow up with local urgent care or community center as discussed, as needed or otherwise directed. Patient is given ED precautions to return to the ED for any worsening or new symptoms.   FINAL CLINICAL IMPRESSION(S) / ED DIAGNOSES   Final diagnoses:  Viral respiratory illness     Rx / DC Orders   ED Discharge Orders          Ordered    azithromycin (ZITHROMAX Z-PAK) 250 MG tablet  Daily        12/13/23 2146             Note:  This document was prepared using Dragon voice  recognition software and may include unintentional dictation errors.    Lissa Hoard, PA-C 12/13/23 2215    Chesley Noon, MD 12/13/23 [phone]

## 2023-12-13 NOTE — ED Triage Notes (Signed)
 Patient to ED via POV for flu like symptoms- body aches, hot/cold flashes, and sleeping a lot. NAD noted. Had fever at home yesterday- 102. Ongoing x1 week.

## 2023-12-13 NOTE — Discharge Instructions (Addendum)
 Your symptoms likely represent a viral infection.  Your strep test and chest x-ray were negative today.  Take the prescription antibiotic as directed.  Continue with OTC Tylenol Motrin as needed.  Follow-up with a local urgent care or community clinic as needed.  Return to ED if necessary.

## 2023-12-13 NOTE — ED Provider Triage Note (Signed)
 Emergency Medicine Provider Triage Evaluation Note  Kerry Ford , a 28 y.o. adult  was evaluated in triage.  Pt complains of cough and congestion for about a week but started running fever 2 days ago.  Review of Systems  Positive:  Negative:   Physical Exam  Ht 5\' 6"  (1.676 m)   Wt 113.4 kg   BMI 40.35 kg/m  Gen:   Awake, no distress   Resp:  Normal effort  MSK:   Moves extremities without difficulty  Other:    Medical Decision Making  Medically screening exam initiated at 6:51 PM.  Appropriate orders placed.  Kerry Ford was informed that the remainder of the evaluation will be completed by another provider, this initial triage assessment does not replace that evaluation, and the importance of remaining in the ED until their evaluation is complete.     Faythe Ghee, PA-C 12/13/23 1851
